# Patient Record
Sex: Female | Born: 1935 | Race: White | Hispanic: No
Health system: Southern US, Community
[De-identification: ages and names within clinical notes are randomized; demographics above are authoritative.]

---

## 2011-04-12 ENCOUNTER — Inpatient Hospital Stay: Payer: Self-pay | Admitting: Internal Medicine

## 2011-06-03 ENCOUNTER — Emergency Department: Payer: Self-pay | Admitting: Emergency Medicine

## 2011-06-03 LAB — COMPREHENSIVE METABOLIC PANEL
Alkaline Phosphatase: 43 U/L — ABNORMAL LOW (ref 50–136)
Anion Gap: 12 (ref 7–16)
BUN: 23 mg/dL — ABNORMAL HIGH (ref 7–18)
Calcium, Total: 8.9 mg/dL (ref 8.5–10.1)
Chloride: 112 mmol/L — ABNORMAL HIGH (ref 98–107)
Creatinine: 1.91 mg/dL — ABNORMAL HIGH (ref 0.60–1.30)
EGFR (African American): 32 — ABNORMAL LOW
Glucose: 85 mg/dL (ref 65–99)
SGOT(AST): 23 U/L (ref 15–37)
SGPT (ALT): 16 U/L
Sodium: 146 mmol/L — ABNORMAL HIGH (ref 136–145)

## 2011-06-03 LAB — CBC WITH DIFFERENTIAL/PLATELET
Basophil %: 0.3 %
Eosinophil %: 3.2 %
HCT: 26.5 % — ABNORMAL LOW (ref 35.0–47.0)
HGB: 9.3 g/dL — ABNORMAL LOW (ref 12.0–16.0)
Lymphocyte #: 2 10*3/uL (ref 1.0–3.6)
MCH: 35.2 pg — ABNORMAL HIGH (ref 26.0–34.0)
MCV: 101 fL — ABNORMAL HIGH (ref 80–100)
Monocyte #: 0.6 10*3/uL (ref 0.0–0.7)
Monocyte %: 6.8 %
Neutrophil #: 6.3 10*3/uL (ref 1.4–6.5)
Neutrophil %: 68.3 %
Platelet: 211 10*3/uL (ref 150–440)
RBC: 2.63 10*6/uL — ABNORMAL LOW (ref 3.80–5.20)

## 2011-06-20 ENCOUNTER — Emergency Department: Payer: Self-pay | Admitting: Emergency Medicine

## 2011-06-21 ENCOUNTER — Ambulatory Visit: Payer: Self-pay | Admitting: Internal Medicine

## 2011-06-24 ENCOUNTER — Ambulatory Visit: Payer: Self-pay | Admitting: Internal Medicine

## 2011-06-28 ENCOUNTER — Ambulatory Visit: Payer: Self-pay | Admitting: Internal Medicine

## 2011-06-28 LAB — CBC CANCER CENTER
Basophil %: 0.1 %
Comment - H1-Com1: NORMAL
Eosinophil #: 0.2 x10 3/mm (ref 0.0–0.7)
Eosinophil %: 2.2 %
Eosinophil: 4 %
Lymphocyte #: 1.7 x10 3/mm (ref 1.0–3.6)
Lymphocyte %: 18.2 %
Lymphocytes: 15 %
Monocyte #: 0.6 x10 3/mm (ref 0.0–0.7)
Monocyte %: 6.9 %
Monocytes: 5 %
Neutrophil #: 6.7 x10 3/mm — ABNORMAL HIGH (ref 1.4–6.5)
Neutrophil %: 72.6 %
Platelet: 236 x10 3/mm (ref 150–440)
RBC: 2.6 10*6/uL — ABNORMAL LOW (ref 3.80–5.20)
RDW: 15.1 % — ABNORMAL HIGH (ref 11.5–14.5)
WBC: 9.3 x10 3/mm (ref 3.6–11.0)

## 2011-06-28 LAB — IRON AND TIBC
Iron Bind.Cap.(Total): 262 ug/dL (ref 250–450)
Iron Saturation: 18 %
Iron: 48 ug/dL — ABNORMAL LOW (ref 50–170)

## 2011-06-28 LAB — LACTATE DEHYDROGENASE: LDH: 202 U/L (ref 84–246)

## 2011-06-28 LAB — RETICULOCYTES: Absolute Retic Count: 0.037 10*6/uL (ref 0.024–0.084)

## 2011-06-28 LAB — FOLATE: Folic Acid: 15.4 ng/mL (ref 3.1–100.0)

## 2011-06-29 LAB — PROT IMMUNOELECTROPHORES(ARMC)

## 2011-06-29 LAB — CANCER CENTER HEMOGLOBIN: HGB: 9.1 g/dL — ABNORMAL LOW (ref 12.0–16.0)

## 2011-07-01 ENCOUNTER — Inpatient Hospital Stay: Payer: Self-pay | Admitting: Internal Medicine

## 2011-07-01 LAB — BASIC METABOLIC PANEL
BUN: 25 mg/dL — ABNORMAL HIGH (ref 7–18)
Chloride: 109 mmol/L — ABNORMAL HIGH (ref 98–107)
EGFR (African American): 34 — ABNORMAL LOW
EGFR (Non-African Amer.): 28 — ABNORMAL LOW
Glucose: 208 mg/dL — ABNORMAL HIGH (ref 65–99)
Osmolality: 301 (ref 275–301)
Potassium: 4.3 mmol/L (ref 3.5–5.1)
Sodium: 146 mmol/L — ABNORMAL HIGH (ref 136–145)

## 2011-07-01 LAB — URINALYSIS, COMPLETE
Glucose,UR: 50 mg/dL (ref 0–75)
Ketone: NEGATIVE
Ph: 6 (ref 4.5–8.0)
Protein: 100
Specific Gravity: 1.008 (ref 1.003–1.030)

## 2011-07-01 LAB — CBC WITH DIFFERENTIAL/PLATELET
Basophil %: 0.2 %
Eosinophil %: 2.1 %
HCT: 28.3 % — ABNORMAL LOW (ref 35.0–47.0)
HGB: 9.6 g/dL — ABNORMAL LOW (ref 12.0–16.0)
Lymphocyte #: 1.6 10*3/uL (ref 1.0–3.6)
MCH: 32 pg (ref 26.0–34.0)
MCV: 94 fL (ref 80–100)
Monocyte #: 0.6 10*3/uL (ref 0.0–0.7)
Monocyte %: 5.9 %
Neutrophil #: 7.3 10*3/uL — ABNORMAL HIGH (ref 1.4–6.5)
Neutrophil %: 75.1 %
RBC: 3.01 10*6/uL — ABNORMAL LOW (ref 3.80–5.20)
WBC: 9.7 10*3/uL (ref 3.6–11.0)

## 2011-07-02 LAB — COMPREHENSIVE METABOLIC PANEL
Albumin: 2.5 g/dL — ABNORMAL LOW (ref 3.4–5.0)
Alkaline Phosphatase: 42 U/L — ABNORMAL LOW (ref 50–136)
Anion Gap: 8 (ref 7–16)
Calcium, Total: 8.6 mg/dL (ref 8.5–10.1)
Chloride: 107 mmol/L (ref 98–107)
Co2: 27 mmol/L (ref 21–32)
EGFR (African American): 33 — ABNORMAL LOW
EGFR (Non-African Amer.): 27 — ABNORMAL LOW
Osmolality: 288 (ref 275–301)
Potassium: 3.7 mmol/L (ref 3.5–5.1)
Total Protein: 5.6 g/dL — ABNORMAL LOW (ref 6.4–8.2)

## 2011-07-02 LAB — CBC WITH DIFFERENTIAL/PLATELET
Basophil #: 0 10*3/uL (ref 0.0–0.1)
Basophil %: 0.3 %
Eosinophil #: 0.3 10*3/uL (ref 0.0–0.7)
Eosinophil %: 4.1 %
HGB: 8.7 g/dL — ABNORMAL LOW (ref 12.0–16.0)
Lymphocyte #: 1.9 10*3/uL (ref 1.0–3.6)
MCH: 31.7 pg (ref 26.0–34.0)
MCHC: 34 g/dL (ref 32.0–36.0)
MCV: 93 fL (ref 80–100)
Monocyte #: 0.7 10*3/uL (ref 0.0–0.7)
Neutrophil #: 4.4 10*3/uL (ref 1.4–6.5)
Neutrophil %: 60.1 %
RBC: 2.75 10*6/uL — ABNORMAL LOW (ref 3.80–5.20)
WBC: 7.3 10*3/uL (ref 3.6–11.0)

## 2011-07-03 LAB — HEMOGLOBIN: HGB: 9.6 g/dL — ABNORMAL LOW (ref 12.0–16.0)

## 2011-07-03 LAB — BASIC METABOLIC PANEL
Anion Gap: 9 (ref 7–16)
BUN: 25 mg/dL — ABNORMAL HIGH (ref 7–18)
Calcium, Total: 8.9 mg/dL (ref 8.5–10.1)
Chloride: 105 mmol/L (ref 98–107)
EGFR (Non-African Amer.): 26 — ABNORMAL LOW
Glucose: 56 mg/dL — ABNORMAL LOW (ref 65–99)
Osmolality: 283 (ref 275–301)
Potassium: 4.2 mmol/L (ref 3.5–5.1)

## 2011-07-03 LAB — FERRITIN: Ferritin (ARMC): 159 ng/mL (ref 8–388)

## 2011-07-04 LAB — CBC WITH DIFFERENTIAL/PLATELET
Basophil #: 0.1 10*3/uL (ref 0.0–0.1)
Basophil %: 0.8 %
Eosinophil #: 0.3 10*3/uL (ref 0.0–0.7)
Eosinophil %: 4.7 %
HGB: 9.2 g/dL — ABNORMAL LOW (ref 12.0–16.0)
Lymphocyte #: 2.6 10*3/uL (ref 1.0–3.6)
MCV: 94 fL (ref 80–100)
Monocyte %: 8.4 %
Neutrophil %: 51 %
Platelet: 216 10*3/uL (ref 150–440)
RBC: 2.91 10*6/uL — ABNORMAL LOW (ref 3.80–5.20)
WBC: 7.4 10*3/uL (ref 3.6–11.0)

## 2011-07-04 LAB — BASIC METABOLIC PANEL
BUN: 30 mg/dL — ABNORMAL HIGH (ref 7–18)
Chloride: 103 mmol/L (ref 98–107)
Creatinine: 2.04 mg/dL — ABNORMAL HIGH (ref 0.60–1.30)
EGFR (African American): 30 — ABNORMAL LOW
EGFR (Non-African Amer.): 25 — ABNORMAL LOW
Glucose: 104 mg/dL — ABNORMAL HIGH (ref 65–99)
Osmolality: 291 (ref 275–301)
Potassium: 3.9 mmol/L (ref 3.5–5.1)
Sodium: 143 mmol/L (ref 136–145)

## 2011-07-05 LAB — URINALYSIS, COMPLETE
Blood: NEGATIVE
Ketone: NEGATIVE
Nitrite: NEGATIVE
Ph: 6 (ref 4.5–8.0)
Protein: 100
RBC,UR: 3 /HPF (ref 0–5)
WBC UR: 674 /HPF (ref 0–5)

## 2011-07-05 LAB — BASIC METABOLIC PANEL
Anion Gap: 8 (ref 7–16)
BUN: 31 mg/dL — ABNORMAL HIGH (ref 7–18)
Calcium, Total: 8.8 mg/dL (ref 8.5–10.1)
Creatinine: 2.15 mg/dL — ABNORMAL HIGH (ref 0.60–1.30)
EGFR (African American): 28 — ABNORMAL LOW
EGFR (Non-African Amer.): 23 — ABNORMAL LOW
Glucose: 131 mg/dL — ABNORMAL HIGH (ref 65–99)
Osmolality: 280 (ref 275–301)
Potassium: 3.9 mmol/L (ref 3.5–5.1)

## 2011-07-06 LAB — CBC WITH DIFFERENTIAL/PLATELET
Basophil %: 0.1 %
Eosinophil #: 0.2 10*3/uL (ref 0.0–0.7)
HCT: 25.9 % — ABNORMAL LOW (ref 35.0–47.0)
HGB: 8.6 g/dL — ABNORMAL LOW (ref 12.0–16.0)
Lymphocyte %: 31.1 %
MCHC: 33 g/dL (ref 32.0–36.0)
MCV: 94 fL (ref 80–100)
Monocyte #: 0.6 10*3/uL (ref 0.0–0.7)
Monocyte %: 6.9 %
Neutrophil #: 4.9 10*3/uL (ref 1.4–6.5)
RBC: 2.75 10*6/uL — ABNORMAL LOW (ref 3.80–5.20)
WBC: 8.2 10*3/uL (ref 3.6–11.0)

## 2011-07-06 LAB — BASIC METABOLIC PANEL
Anion Gap: 9 (ref 7–16)
BUN: 34 mg/dL — ABNORMAL HIGH (ref 7–18)
Calcium, Total: 8.9 mg/dL (ref 8.5–10.1)
Creatinine: 2.17 mg/dL — ABNORMAL HIGH (ref 0.60–1.30)
EGFR (African American): 28 — ABNORMAL LOW
EGFR (Non-African Amer.): 23 — ABNORMAL LOW
Glucose: 154 mg/dL — ABNORMAL HIGH (ref 65–99)
Potassium: 4.1 mmol/L (ref 3.5–5.1)
Sodium: 139 mmol/L (ref 136–145)

## 2011-07-06 LAB — URINE CULTURE

## 2011-07-08 LAB — URINE CULTURE

## 2011-07-13 ENCOUNTER — Inpatient Hospital Stay: Payer: Self-pay | Admitting: Internal Medicine

## 2011-07-13 LAB — CBC
MCHC: 34.2 g/dL (ref 32.0–36.0)
Platelet: 231 10*3/uL (ref 150–440)
RBC: 2.81 10*6/uL — ABNORMAL LOW (ref 3.80–5.20)
WBC: 12.6 10*3/uL — ABNORMAL HIGH (ref 3.6–11.0)

## 2011-07-13 LAB — URINALYSIS, COMPLETE
Glucose,UR: NEGATIVE mg/dL (ref 0–75)
Hyaline Cast: 11
Ketone: NEGATIVE
Nitrite: NEGATIVE
Ph: 5 (ref 4.5–8.0)
RBC,UR: 7 /HPF (ref 0–5)
Specific Gravity: 1.012 (ref 1.003–1.030)
Squamous Epithelial: 1
WBC UR: 189 /HPF (ref 0–5)

## 2011-07-13 LAB — LIPASE, BLOOD: Lipase: 129 U/L (ref 73–393)

## 2011-07-13 LAB — COMPREHENSIVE METABOLIC PANEL
Alkaline Phosphatase: 35 U/L — ABNORMAL LOW (ref 50–136)
Bilirubin,Total: 0.3 mg/dL (ref 0.2–1.0)
Calcium, Total: 8.7 mg/dL (ref 8.5–10.1)
Co2: 21 mmol/L (ref 21–32)
Creatinine: 2.36 mg/dL — ABNORMAL HIGH (ref 0.60–1.30)
EGFR (African American): 25 — ABNORMAL LOW
EGFR (Non-African Amer.): 21 — ABNORMAL LOW
Osmolality: 290 (ref 275–301)
Potassium: 4.7 mmol/L (ref 3.5–5.1)
SGOT(AST): 28 U/L (ref 15–37)
SGPT (ALT): 17 U/L

## 2011-07-14 LAB — BASIC METABOLIC PANEL
Anion Gap: 10 (ref 7–16)
Calcium, Total: 8.5 mg/dL (ref 8.5–10.1)
Chloride: 107 mmol/L (ref 98–107)
Co2: 24 mmol/L (ref 21–32)
Creatinine: 2.51 mg/dL — ABNORMAL HIGH (ref 0.60–1.30)
Osmolality: 293 (ref 275–301)
Potassium: 4.4 mmol/L (ref 3.5–5.1)

## 2011-07-14 LAB — CBC WITH DIFFERENTIAL/PLATELET
Basophil #: 0 10*3/uL (ref 0.0–0.1)
Eosinophil #: 0.2 10*3/uL (ref 0.0–0.7)
Eosinophil %: 1.9 %
Lymphocyte %: 23.6 %
MCH: 32.5 pg (ref 26.0–34.0)
Monocyte #: 0.8 10*3/uL — ABNORMAL HIGH (ref 0.0–0.7)
Neutrophil %: 65.8 %
Platelet: 201 10*3/uL (ref 150–440)
RBC: 2.42 10*6/uL — ABNORMAL LOW (ref 3.80–5.20)

## 2011-07-14 LAB — CK: CK, Total: 409 U/L — ABNORMAL HIGH (ref 21–215)

## 2011-07-15 LAB — CBC WITH DIFFERENTIAL/PLATELET
Basophil #: 0.1 10*3/uL (ref 0.0–0.1)
Basophil %: 1.2 %
Eosinophil #: 0.1 10*3/uL (ref 0.0–0.7)
HCT: 27.3 % — ABNORMAL LOW (ref 35.0–47.0)
HGB: 9 g/dL — ABNORMAL LOW (ref 12.0–16.0)
Lymphocyte %: 23.7 %
MCHC: 33 g/dL (ref 32.0–36.0)
Monocyte %: 9.8 %
Neutrophil #: 6.5 10*3/uL (ref 1.4–6.5)
Neutrophil %: 64.1 %
Platelet: 197 10*3/uL (ref 150–440)
RBC: 2.99 10*6/uL — ABNORMAL LOW (ref 3.80–5.20)
RDW: 20 % — ABNORMAL HIGH (ref 11.5–14.5)
WBC: 10.1 10*3/uL (ref 3.6–11.0)

## 2011-07-15 LAB — BASIC METABOLIC PANEL
Anion Gap: 15 (ref 7–16)
BUN: 42 mg/dL — ABNORMAL HIGH (ref 7–18)
Calcium, Total: 8.2 mg/dL — ABNORMAL LOW (ref 8.5–10.1)
Co2: 21 mmol/L (ref 21–32)
EGFR (Non-African Amer.): 19 — ABNORMAL LOW
Glucose: 157 mg/dL — ABNORMAL HIGH (ref 65–99)
Osmolality: 293 (ref 275–301)
Potassium: 4.6 mmol/L (ref 3.5–5.1)

## 2011-07-15 LAB — SEDIMENTATION RATE: Erythrocyte Sed Rate: 44 mm/hr — ABNORMAL HIGH (ref 0–30)

## 2011-07-16 LAB — URINE CULTURE

## 2011-07-17 LAB — CBC WITH DIFFERENTIAL/PLATELET
Basophil #: 0 10*3/uL (ref 0.0–0.1)
Basophil %: 0.2 %
Eosinophil %: 0.2 %
HCT: 28.4 % — ABNORMAL LOW (ref 35.0–47.0)
Lymphocyte #: 1.9 10*3/uL (ref 1.0–3.6)
Lymphocyte %: 18.1 %
MCH: 30.5 pg (ref 26.0–34.0)
MCHC: 33.7 g/dL (ref 32.0–36.0)
Monocyte %: 7.5 %
Neutrophil #: 7.7 10*3/uL — ABNORMAL HIGH (ref 1.4–6.5)
Platelet: 229 10*3/uL (ref 150–440)
RBC: 3.13 10*6/uL — ABNORMAL LOW (ref 3.80–5.20)
RDW: 19.1 % — ABNORMAL HIGH (ref 11.5–14.5)
WBC: 10.4 10*3/uL (ref 3.6–11.0)

## 2011-07-17 LAB — BASIC METABOLIC PANEL
Creatinine: 2.64 mg/dL — ABNORMAL HIGH (ref 0.60–1.30)
EGFR (African American): 22 — ABNORMAL LOW
EGFR (Non-African Amer.): 18 — ABNORMAL LOW
Osmolality: 294 (ref 275–301)
Sodium: 133 mmol/L — ABNORMAL LOW (ref 136–145)

## 2011-07-19 LAB — BASIC METABOLIC PANEL
Anion Gap: 11 (ref 7–16)
Calcium, Total: 9.4 mg/dL (ref 8.5–10.1)
Chloride: 99 mmol/L (ref 98–107)
Creatinine: 2.52 mg/dL — ABNORMAL HIGH (ref 0.60–1.30)
EGFR (African American): 23 — ABNORMAL LOW
EGFR (Non-African Amer.): 19 — ABNORMAL LOW
Glucose: 248 mg/dL — ABNORMAL HIGH (ref 65–99)
Osmolality: 292 (ref 275–301)
Sodium: 133 mmol/L — ABNORMAL LOW (ref 136–145)

## 2011-07-19 LAB — CBC WITH DIFFERENTIAL/PLATELET
Basophil #: 0 10*3/uL (ref 0.0–0.1)
Basophil %: 0.4 %
Eosinophil #: 0.2 10*3/uL (ref 0.0–0.7)
Eosinophil %: 1.6 %
HCT: 31.7 % — ABNORMAL LOW (ref 35.0–47.0)
HGB: 10.5 g/dL — ABNORMAL LOW (ref 12.0–16.0)
Lymphocyte #: 2.1 10*3/uL (ref 1.0–3.6)
MCH: 30.9 pg (ref 26.0–34.0)
MCHC: 33.1 g/dL (ref 32.0–36.0)
MCV: 93 fL (ref 80–100)
Monocyte #: 0.9 10*3/uL — ABNORMAL HIGH (ref 0.0–0.7)
Neutrophil #: 7.6 10*3/uL — ABNORMAL HIGH (ref 1.4–6.5)
Neutrophil %: 70.2 %
RBC: 3.41 10*6/uL — ABNORMAL LOW (ref 3.80–5.20)

## 2011-07-19 LAB — CULTURE, BLOOD (SINGLE)

## 2011-07-22 ENCOUNTER — Ambulatory Visit: Payer: Self-pay | Admitting: Internal Medicine

## 2011-08-22 DEATH — deceased

## 2013-02-12 IMAGING — CR PELVIS - 1-2 VIEW
1 series · 1 of 1 positions shown · non-contrast
Comparison: none

REASON FOR EXAM: Left groin pain , s/p fall
COMMENTS:

PROCEDURE:     DXR - DXR PELVIS AP ONLY  - April 12, 2011 [DATE]
RESULT:     The bony pelvis is mildly osteopenic. I do not see evidence of
an acute fracture. There is narrowing of the hip joints. There are
phleboliths within the pelvis. The bowel gas pattern is nonspecific.

[view not recorded]
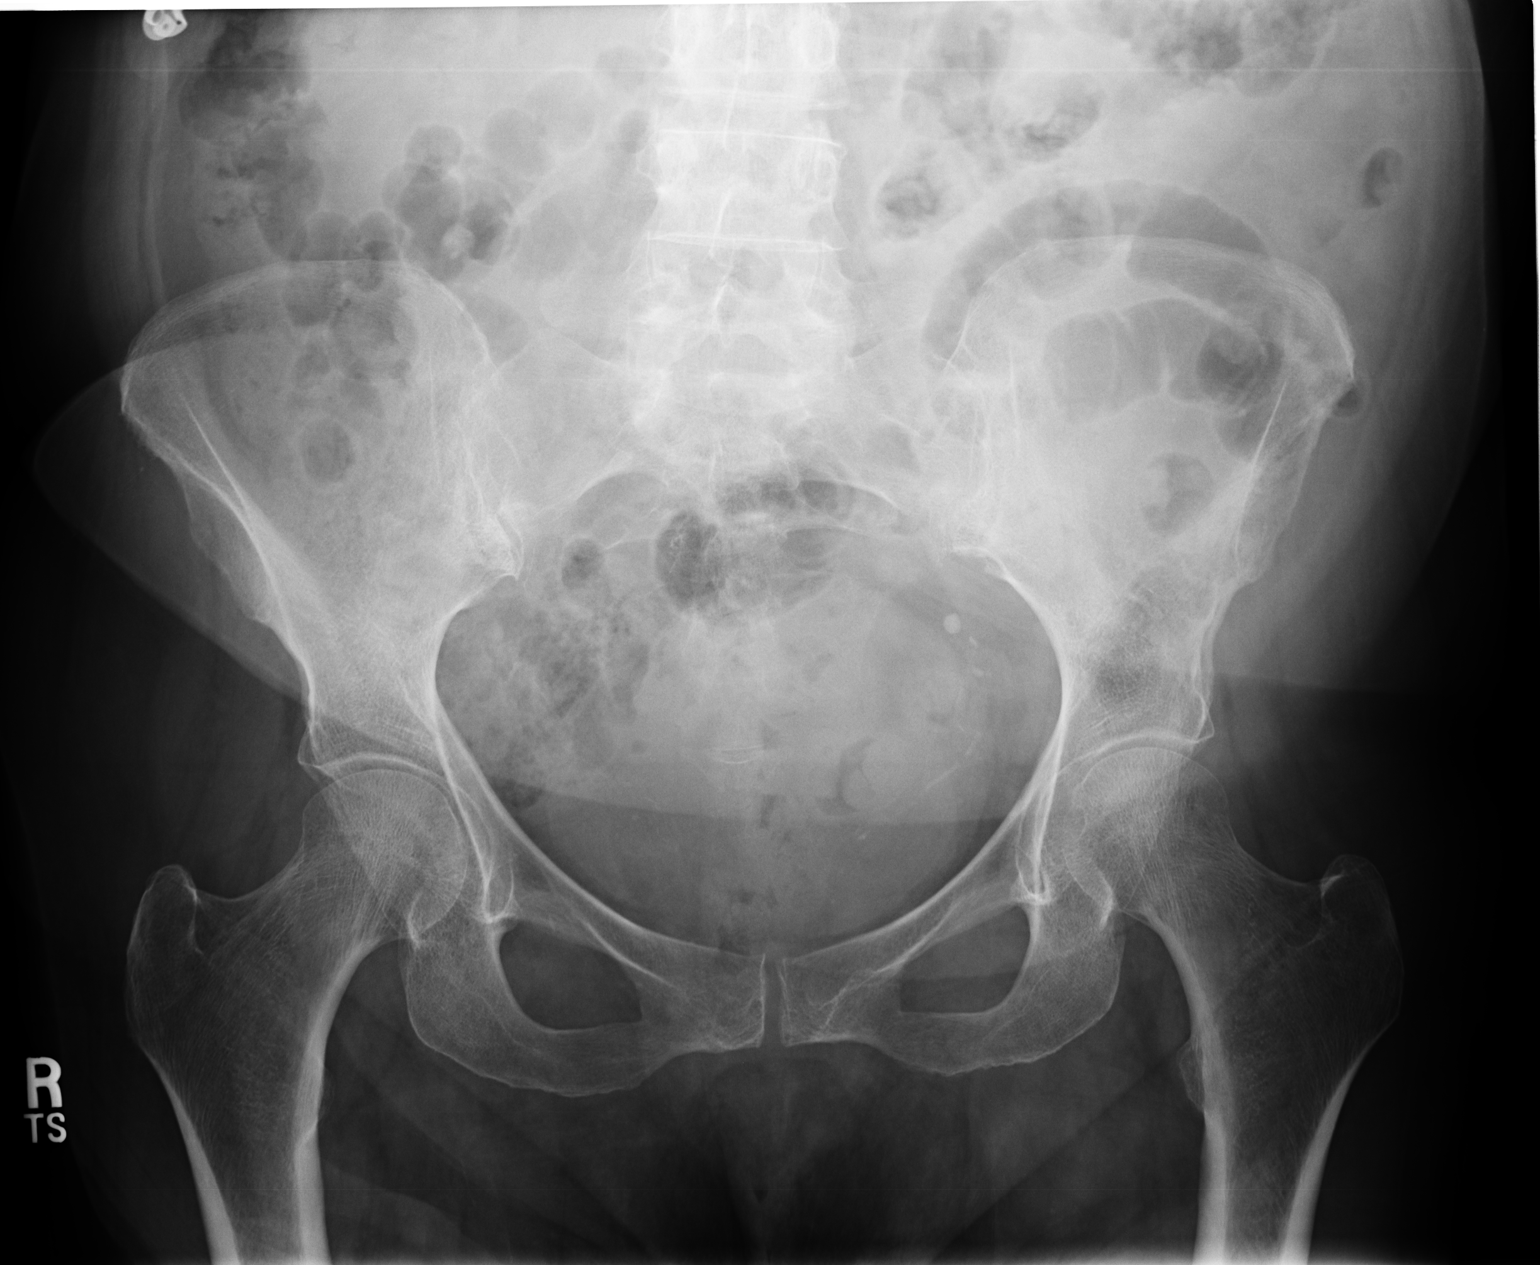

[1 of 1 positions shown; findings below may reference images not displayed]

IMPRESSION: I do not see evidence of an acute fracture of the bony
pelvis. There are degenerative changes of both hips. Followup imaging is
available if there are strong clinical concerns of an occult fracture.

## 2013-02-13 IMAGING — CT CT OF THE LEFT HIP WITHOUT CONTRAST
1 series · 16 of 32 positions shown, 20 images · non-contrast
Comparison: none

REASON FOR EXAM: pain s/p fall, unable to ambulate, r/o fracture
COMMENTS:

PROCEDURE:     CT  - CT HIP LEFT WITHOUT CONTRAST  - April 13, 2011  [DATE]
RESULT:
TECHNIQUE: Multislice helical acquisition through the left hip is
reconstructed in the axial, coronal and sagittal planes at 2.0 mm slice
thickness.
There is no previous CT for comparison.

[Series 2: hip 3.0 b70s · axial · 0.38mm/px · z∈[-840,-714]mm · 16 of 47 slices shown, 20 images]
[im 3/47  soft-tissue]
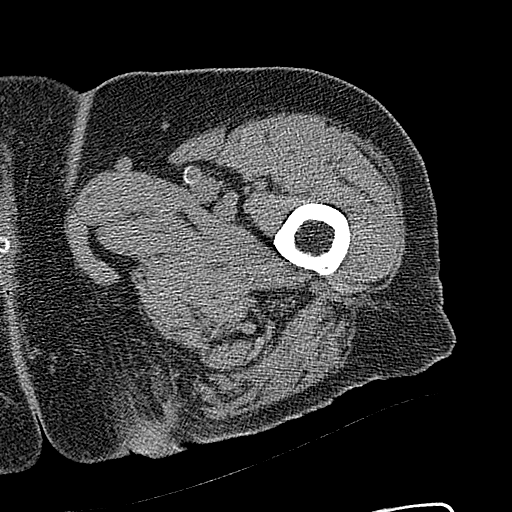
[im 3/47  bone]
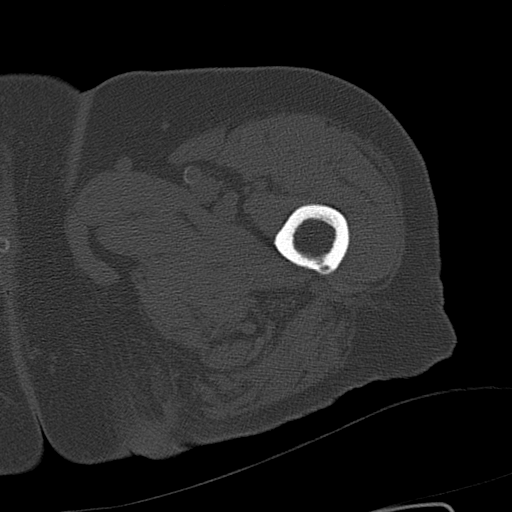
[im 6/47  soft-tissue]
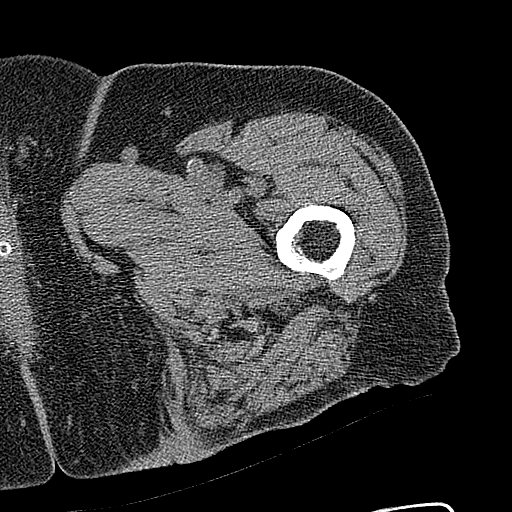
[im 9/47  soft-tissue]
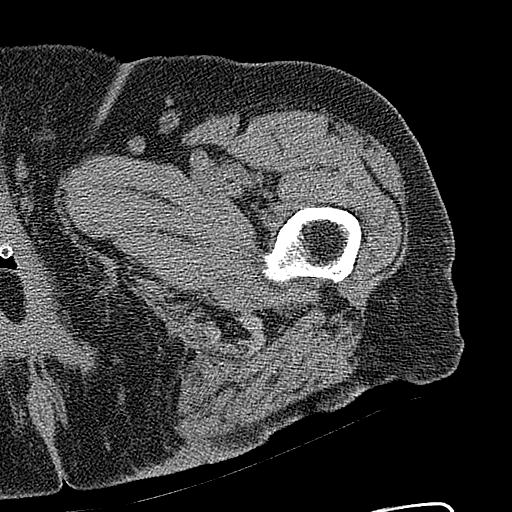
[im 12/47  soft-tissue]
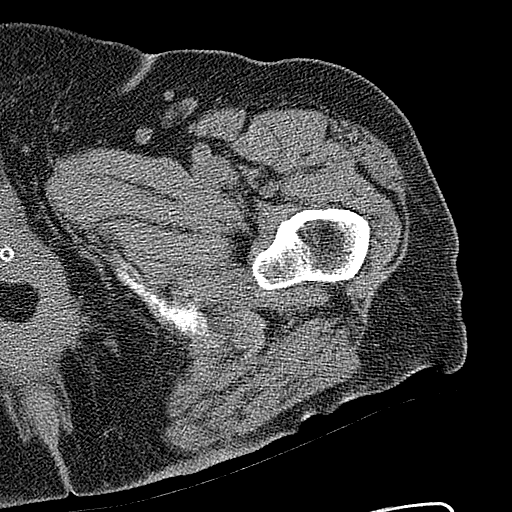
[im 15/47  soft-tissue]
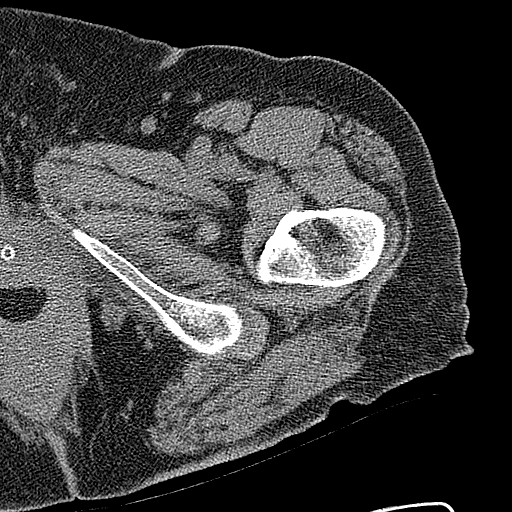
[im 18/47  soft-tissue]
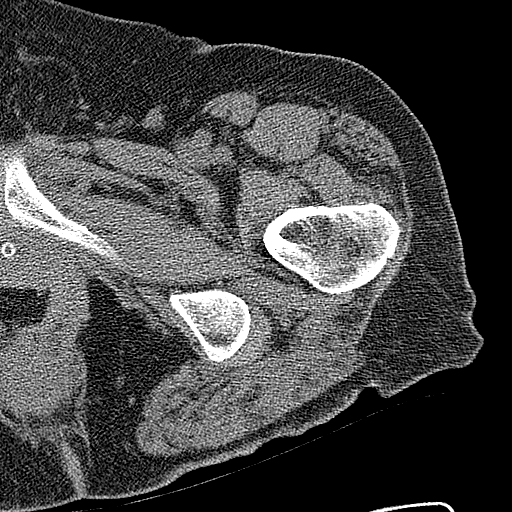
[im 21/47  soft-tissue]
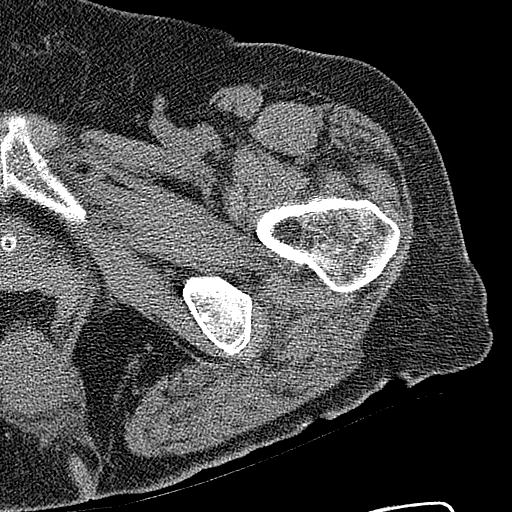
[im 26/47  soft-tissue]
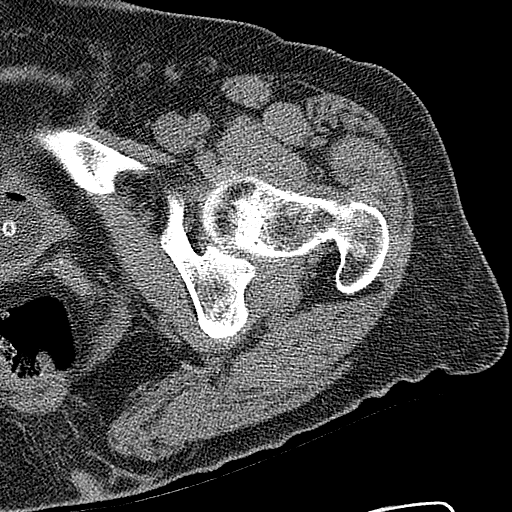
[im 29/47  soft-tissue]
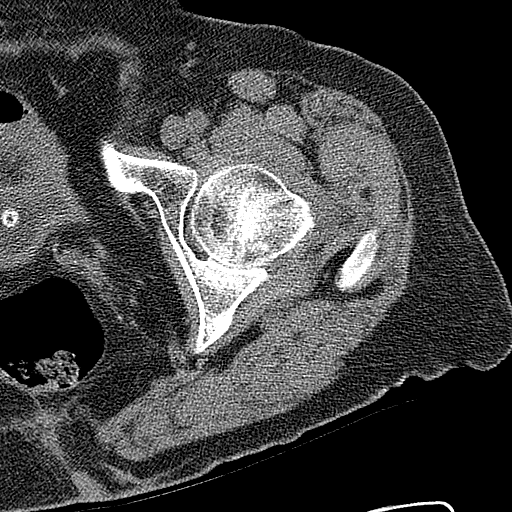
[im 29/47  bone]
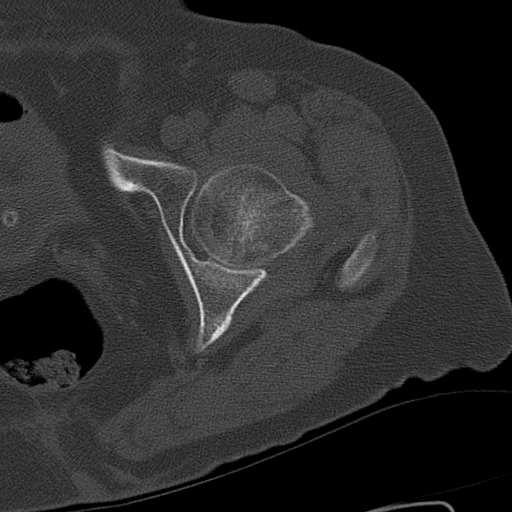
[im 32/47  soft-tissue]
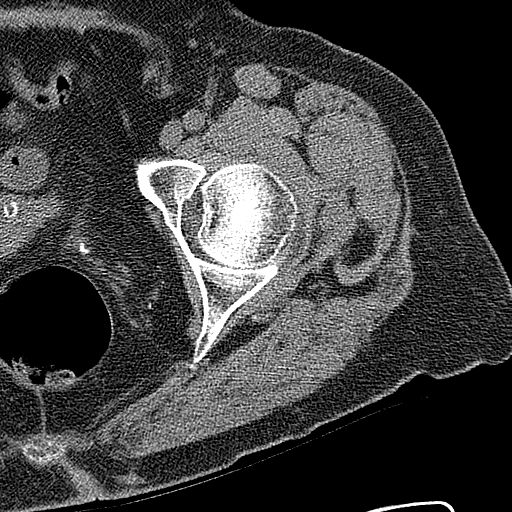
[im 35/47  soft-tissue]
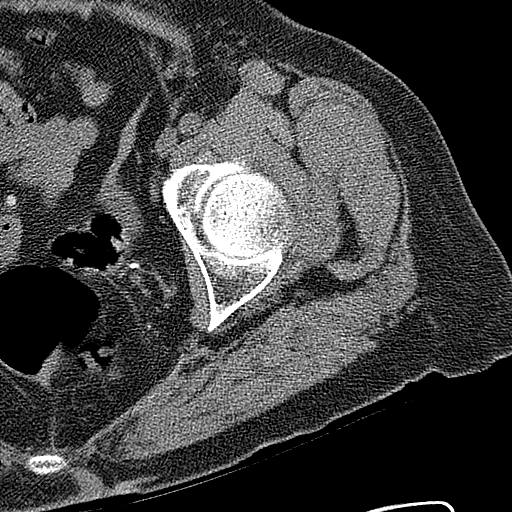
[im 38/47  soft-tissue]
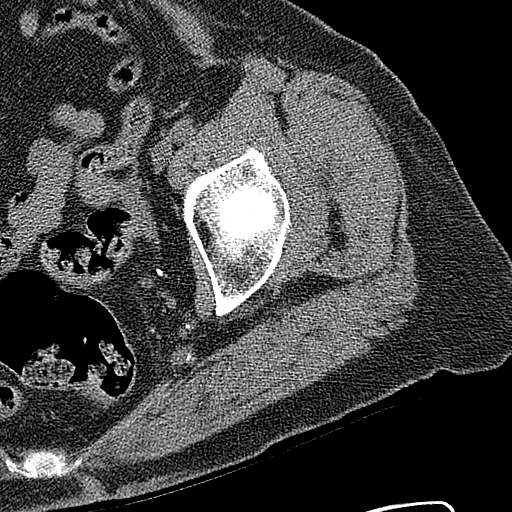
[im 41/47  soft-tissue]
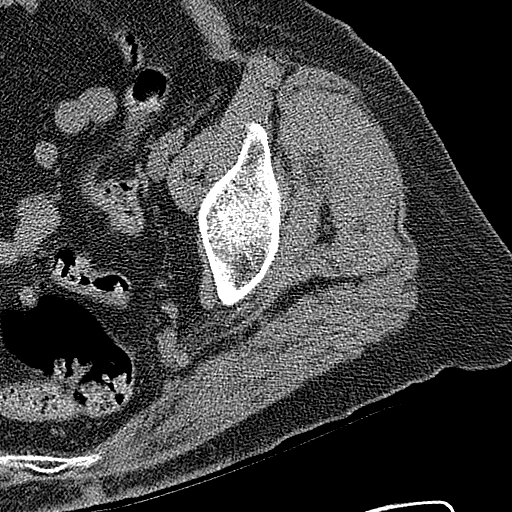
[im 41/47  lung]
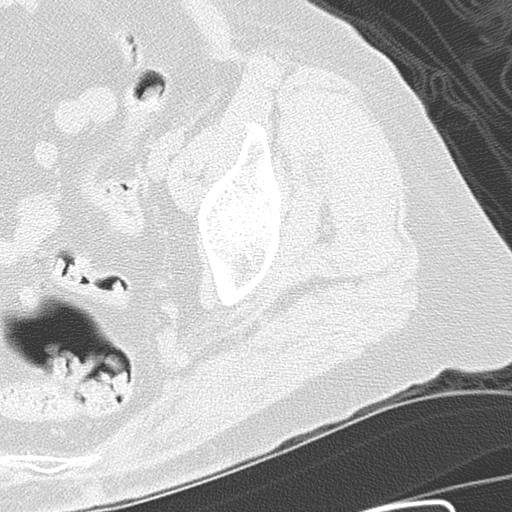
[im 42/47  lung]
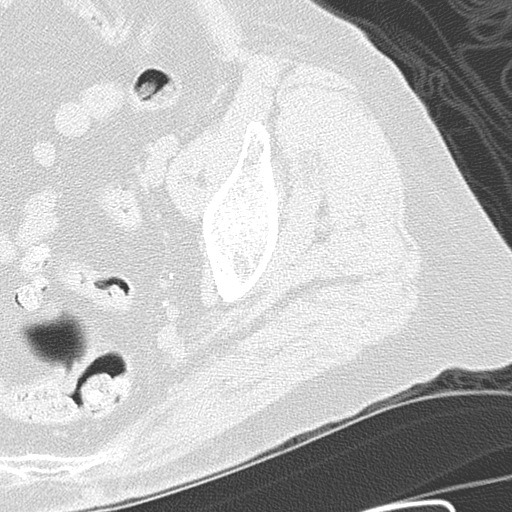
[im 44/47  soft-tissue]
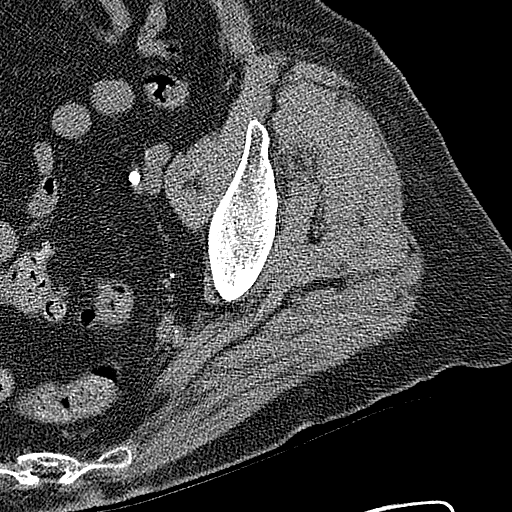
[im 44/47  lung]
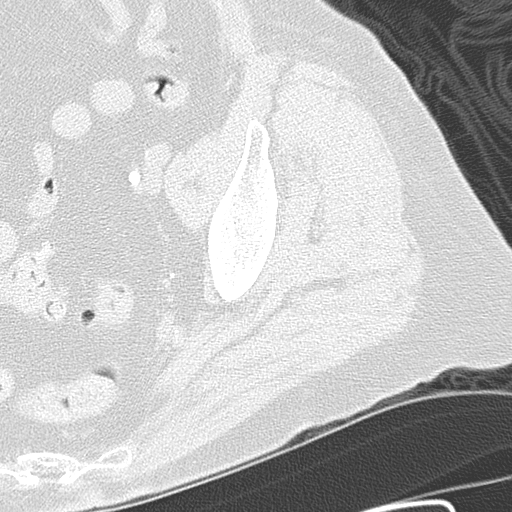
[im 45/47  lung]
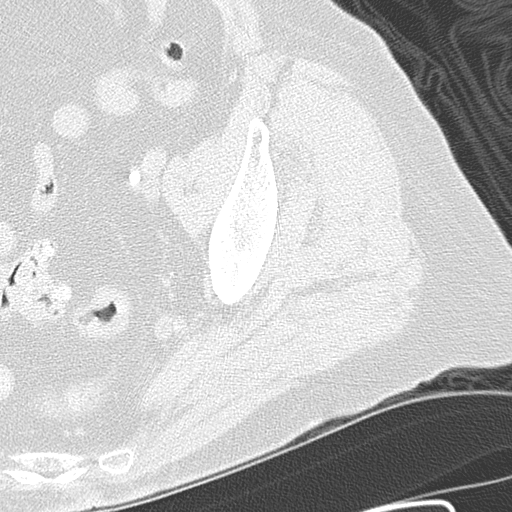

[16 of 32 positions shown; findings below may reference images not displayed]

FINDINGS: The visualized bony pelvis is intact. The femoral head is located
in the acetabulum. The proximal femur is intact. There is no cortical
destruction or fracture.
IMPRESSION: No acute left hip bony abnormality.

## 2014-09-14 NOTE — Consult Note (Signed)
PATIENT NAME:  Madison Stanley, Madison Stanley [id number] OF BIRTH:  1923-07-20 OF ADMISSION:  02/08/2013OF CONSULTATION:  07/05/2011  REFERRING PHYSICIAN: Enid Baas, MDPHYSICIAN:  Kristine Linea, MD FOR CONSULTATION: To evaluate an elderly patient with new onset depression.   DATA: Ms. Frank is an 81 year old female with no psychiatric history.   COMPLAINT: "I worry." OF PRESENT ILLNESS: Ms. Fiallos has no psychiatric history. She was admitted for worsening of edema. She suffered a CVA in November 2012 and has been wheel chair bound since. There is residual left sided weakness that likely led to a fall off the chair. She has bruises on the left side of her face. Until her stroke, the patient had been living independently, driving and cooking. She now worries that unless she is able to walk again, she will not be able to return to her home. It may not be an option as she has several physical problems and her health has been deteriorating. She looks frail, has severe anemia requiring blood transfusion and feels tired. Her edema did not respond to oral treatment. The patient complains of poor sleep, decreased appetite but is unsure if she lost weight. She feels sad and uncertain about her future. Her daughter reported that the patient has been crying a lot at night. She has been treated with Remeron for depression. She was started on Zoloft in the hospital. PSYCHIATRIC HISTORY: None reported. PSYCHIATRIC HISTORY: Unknown. MEDICAL HISTORY:  1. Hypertension.  2. Type 2 diabetes mellitus, insulin-dependent.  Coronary artery disease with stent placement.  Diverticulosis.  Osteoarthritis.  Anemia of chronic disease.  Cerebrovascular accident with acute right parietofrontal infarct resulting in residual left-sided weakness.  Carotid stenosis, left internal carotid artery, 50% to 75% stenosis.   DRUG ALLERGIES: Sulfa, metformin, penicillin, Demerol. MEDICATIONS:  1. Lantus 10 units subcutaneous at bedtime.  2. Ferrous  sulfate 325 mg p.o. b.i.d.  Aggrenox 25/200 mg one capsule p.o. b.i.d.  Synthroid 88 mcg p.o. daily.  Toprol-XL 50 mg p.o. daily.  Lisinopril 40 mg p.o. daily.  Norvasc 10 mg p.o. daily.  Flomax 0.4 mg p.o. daily.  Vitamin B12 500 mcg p.o. daily.  Bumex 1 mg p.o. daily.  Glyburide 10 mg p.o. daily.  Lasix 10 mg p.o. daily.  Zocor 20 mg p.o. at bedtime.  Remeron 15 mg p.o. at bedtime.  Vicodin 5/500 one tablet every four hours p.r.n. for pain.  Tylenol 650 mg p.o. every 4 hours p.r.n. for pain.  Ultram 50 mg p.o. every 6 hours p.r.n. for pain.  Xanax 0.5 mg p.o. at bedtime p.r.n.  Xanax 0.5 mg p.o. twice a day p.r.n. for anxiety.  Cipro 250 mg p.o. twice a day for seven days for urinary tract infection, will be finished on 07/02/2011. HISTORY: She is a retired Engineer, civil (consulting). She used to live independently until her CVA in November 2012. She currently resides at [facility]. No history of alcohol or tobacco use. She has two daughters one of which is a physical therapist and the other one is an Nutritional therapist.  OF SYSTEMS: CONSTITUTIONAL: No fever. Positive for fatigue and weakness. EYES: No double or blurred vision. ENT: No hearing loss. RESPIRATORY: No cough or shortness of breath. CARDIOVASCULAR: No chest pain or orthopnea. Positive for edema. GI: No nausea, vomiting or abdominal pain. GENITOURINARY: Positive for UTI. ENDOCRINE: No heat or cold intolerance. HEMATOLOGY: Positive for anemia. No easy bruising or bleeding. SKIN: No acne or rash. MUSCULOSKELETAL: Positive for chronic low back pain and arthritis. NEUROLOGIC: Recent cerebrovascular accident with left residual  hemiparesis. PSYCHIATRIC: See history of present illness for details.   EXAMINATION: VITAL SIGNS: blood pressure 111/58, pulse 68, respirations 20, temperature 97. GENERAL: This is a well-nourished elderly female slumped in bed in no acute distress. The rest of the physical examination is deferred to the primary attending. Chemistries within normal limits  except glucose 131, BUN 31, creatinine 2.15. LFTs within normal limits, CBC: WBC 7.4, hemoglobin 9.1, hematocrit 27.5, platelets 219. UA is suggestive of UTI with LE 3+ and 403 WBC.   STATUS EXAMINATION: The patient is alert and oriented to person, place and time, not so much to the situation. She is unable to explain why she is in the hospital again even though she is a good historian. She wears hospital gown that is untied. The room is cold but the patient does not wish to increase the temperature. There is some eye contact. Her speech is clear. There are no problems with hearing. She is preoccupied with thoughts about going home. She seems to be completely unrealistic in her expectations. She understands that she will not be able to return home if she is unable to walk. She worked with PT today and this went "well". She is unable to tell me how far she went if at all. I was in the room when her lunch tray came in. She was interested in eating her hamburger. She needed help to do so. Her mood is "sad" with flat affect. No crying. She denies thoughts of hurting herself or others. There are no psychotic symptoms. Cognition seems intact, actually very good for her age. Her insight and judgment are questionable.  RISK ASSESSMENT: This is a patient with new onset depression in the context of worsening of medical condition and loss of way of life. She needs support and supervision.     DIAGNOSES: I:  Adjustment disorder with depressed mood. II:  Deferred,   AXIS III:  UTI, CKD, Hypertension, diabetes, Coronary artery disease with stent placement, Diverticulosis, Osteoarthritis, Anemia of chronic disease, Cerebrovascular accident with left-sided weakness, Carotid stenosis.  AXIS IV:  Physical illness, loss of way of life.   V:  GAF 40.  Will decrease Remeron to better address insomnia.  Will increase Zoloft to 50 mg daily.  I will follow up.    Electronic Signatures: Kristine LineaPucilowska, Jolanta (MD)  (Signed on  13-Feb-13 10:22)  Authored  Last Updated: 13-Feb-13 10:22 by Kristine LineaPucilowska, Jolanta (MD)

## 2014-09-14 NOTE — Consult Note (Signed)
PATIENT NAME:  Madison Stanley, Madison Stanley MR#:  [id number] DATE OF BIRTH:  [birth year]  DATE OF CONSULTATION:  07/15/2011  REFERRING PHYSICIAN:   CONSULTING PHYSICIAN:  Laurene Footman, MD  REASON FOR CONSULTATION: Left hip and groin pain.   HISTORY OF PRESENT ILLNESS: Patient is an 81 year old patient with multiple medical problems including renal disease not yet on dialysis. She has been having increased pain for two days prior to admission, had difficulty flexing the hip with significant pain. She is admitted because of her difficulty with ambulation. She does have prior left-sided weakness secondary to a CVA and subdural hematoma. There is history of a fall recently and urinary tract infection.   PHYSICAL EXAMINATION: On exam she has no pain with gentle hip logrolling. With passive flexion and extension there is some pain in the anterior hip. She has slight tenderness to palpation at the anterior hip. There is no warmth or erythema about the hip. No significant pain with very gentle and slow internal rotation or external rotation. She does have a great deal of pain with attempt at flexion actively.   LABORATORY, DIAGNOSTIC AND RADIOLOGICAL DATA: She had an MRI done yesterday that showed an apparent injury to the psoas tendon with rupture at the attachment.  CLINICAL IMPRESSION: Psoas tendon rupture with adjacent inflammation.   RECOMMENDATIONS: Ordered a CRP and ESR although I doubt infection. Potential for improvement with time is likely. Additionally, physical therapy has been ordered. I do not know if she has been on quinolones as they can potentially cause tendon rupture although in this area I have not seen that. She will continue to be followed here in the hospital.  ____________________________ Laurene Footman, MD mjm:cms D: 07/15/2011 07:51:00 ET T: 07/15/2011 08:10:04 ET JOB#: [id number]  cc: Laurene Footman, MD, <Dictator> Laurene Footman MD ELECTRONICALLY SIGNED 07/15/2011 17:30

## 2014-09-14 NOTE — Consult Note (Signed)
Brief Consult Note: Diagnosis: Adjustmant disorder with depressed mood.   Patient was seen by consultant.   Consult note dictated.   Recommend further assessment or treatment.   Orders entered.   Comments: Ms. Delton Seeelson has no psychiatric history, not even cogntive decline. She has been taking Remeron prescribed by her PCP. She has been living independently. She feels depressed as she fears that she may not be able to return to her independent living if she is unable to walk. There is a h/o of falls. She is working with PT.  PLAN: 1. She is on Remeron. I will lower Remeron to 7.5. The patient has problems sleeping. The higher the Remeron dose the less sleep inducing activity.  2. She has been already started on Zoloft. Will increase Zoloft to 50 mg.   3. I will follow up.  Electronic Signatures: Kristine LineaPucilowska, Kallan Merrick (MD)  (Signed 12-Feb-13 13:26)  Authored: Brief Consult Note   Last Updated: 12-Feb-13 13:26 by Kristine LineaPucilowska, Aleynah Rocchio (MD)

## 2014-09-14 NOTE — Consult Note (Signed)
    Comments   Follow up visit made. Pt is still sedated, unresponsive. Spoke with daughter, Coy SaunasRosemary. She has spoken with her brother and sister and they are in [facility]. Mariane DuvalGinny Ward, RN, hospital liason for the [facility] aware and will see pt in AM.   Electronic Signatures: Tyiana Hill, Harriett SineNancy (MD)  (Signed 26-Feb-13 15:29)  Authored: Palliative Care   Last Updated: 26-Feb-13 15:29 by Joplin Canty, Harriett SineNancy (MD)

## 2014-09-14 NOTE — H&P (Signed)
PATIENT NAME:  Madison Stanley, Madison Stanley MR#:  [id number] DATE OF BIRTH:  1923-06-07  DATE OF ADMISSION:  07/13/2011  PRIMARY CARE PHYSICIAN: Lorie Phenix, MD   She is a resident of [facility].   CHIEF COMPLAINT: Left groin pain. History was obtained from the patient and the daughters.   HISTORY OF PRESENT ILLNESS: This is an 81 year old female who has multiple medical problems. She has diabetes, hypertension, hyperlipidemia, chronic kidney disease stage IV, she follows with Nephrology, coronary artery disease. She was admitted in May of 2012. She was found to have acute right frontoparietal infarct at that time. She was recently admitted to the hospital on 07/01/2011 and she was discharged on 07/06/2011 with a diagnosis of acute on chronic diastolic failure, anasarca, CKD stage IV, malignant hypertension, diabetes mellitus with hypoglycemia, and urinary retention. She was receiving physical therapy at [facility]. She was brought in today because of severe left groin pain radiating to her back. The daughter is an Nutritional therapist. She says that the patient was able to do some physical therapy yesterday but she has been complaining of some left groin pain since Sunday. She did not have any recent fall. The last fall was two weeks ago and she had a hospital stay after the fall and she was getting physical therapy in the hospital. She has been complaining of left groin pain in the past also but this is the most severe pain that she has complained of today. She was having some nausea and vomiting a few days ago but she says the nausea and vomiting has improved right now. She got multiple doses of morphine in the Emergency Room. She got about a total of 4 mg of IV morphine. She had a CT of the abdomen and pelvis done in the Emergency Room without contrast which only showed mild fluid signal and expansion of the left iliacus muscle which may represent injury and recommended MRI. Hospitalist was asked to admit the patient  because of significant left hip pain and groin pain without any obvious fracture on the CAT scan. She was also found to have abnormal urinalysis with an indwelling Foley catheter showing a leukocyte esterase of 3+ and 189 WBCs per high-power field. She also got a dose of IV Rocephin in the Emergency Room. She denies any chest pain, any shortness of breath. She is being admitted for left groin pain and hip pain with chronic diastolic failure, CKD stage IV, anemia of chronic disease, hypertension, diabetes, and urinary tract infection.     PAST MEDICAL HISTORY:  1. CKD stage IV. She follows with Dr. Thedore Mins and Dr. Cherylann Ratel. Her last creatinine at discharge was 2.17 on February 13th with GFR of around 23.  2. Diabetes with hypoglycemia.  3. Hypertension.  4. Hyperlipidemia.  5. Hypothyroidism.  6. Coronary artery disease. 7. History of subdural hematoma, tiny, in May of 2012.  8. History of GI bleed in February of 2012.  9. She was admitted in November of 2012 because of acute left frontoparietal infarct. She has carotid stenosis in the range of 50%. She also followed with Vascular Surgery.  10. Anemia of chronic disease. She follows with Dr. Sherrlyn Hock at the [facility]. 11. She was recently admitted from 07/01/2011 to 07/06/2011 because of anasarca because of acute on chronic diastolic failure and CKD stage IV. 12. Depression.  13. She had altered mental status because of high doses of Remeron. 14.  She also had urinary retention and now she has an indwelling Foley  catheter inserted at [facility].   PAST SURGICAL HISTORY:  1. Hysterectomy. 2. Bladder surgery.  3. Cholecystectomy.  4. Breast biopsy. 5. Right knee replacement.   ALLERGIES TO MEDICATIONS: Demerol, metformin, penicillin, and sulfa drugs.   HOME MEDICATIONS FROM [facility]:  1. Aggrenox 1 tab p.o. b.i.d.  2. Synthroid 88 mcg daily.  3. Ultram 50 mg q.6 hours as needed.  4. Toprol-XL 50 mg daily. 5. Norvasc 10 mg  daily.  6. Flomax 0.4 mg daily.  7. Zocor 20 mg at bedtime.  8. Ferrous sulfate 325 mg b.i.d.  9. Vitamin B12 500 mcg 4 tabs p.o. once daily. 10. Bumetanide 1 mg once a day. 11. Norco 5/500 q.4 hours p.r.n.  12. Tylenol 325 mg 2 tabs q.4 hours p.r.n.  13. Xanax XR 0.5 mg p.o. b.i.d. p.r.n.  14. Lisinopril 40 mg p.o. b.i.d.  15. Imdur 30 mg daily.  16. Zoloft 50 mg at bedtime. 17. Remeron 7.5 mg at bedtime.  18. Hydralazine 50 mg q.i.d.  19. Sliding scale coverage. 20. Lantus and glyburide have been discontinued.   SOCIAL HISTORY: She is a resident at [facility] since her stroke in November of 2012. No smoking or alcohol use.   FAMILY HISTORY: History of heart disease in her mother.   PHYSICAL EXAMINATION:   VITAL SIGNS: When she presented to the Emergency Room, temperature 98.6, heart rate 85, respiratory rate 20, blood pressure 123/52, saturating 85% on room air. Currently heart rate 85, blood pressure 117/45, saturating 95% on 2 liters nasal cannula.   GENERAL: This is an elderly Caucasian female. She is drowsy right now after the morphine.   HEENT: Bilateral pupils are equal. Extraocular muscles are intact. No scleral icterus. No conjunctivitis. Oral mucosa is moist, pale.   NECK: No thyroid tenderness, enlargement, or nodules. Neck is supple. No masses, nontender. No adenopathy. No JVD. No carotid bruit.   CHEST: She has some crackles at the left base, essentially clear. Normal respiratory effort. Not using accessory muscles of respiration.   HEART: Heart sounds are regular. No murmur. She has lower extremity edema but has improved from before. Bilateral dorsalis pedis is 1+.   ABDOMEN: Soft, nontender. Normal bowel sounds. No hepatosplenomegaly. No bruit. No masses.   RECTAL: Deferred at this time.    NEUROLOGIC: She is awake. She is drowsy after the morphine but arousable. She is moving her right lower extremity against gravity but she is unable to flex her left hip.  She is complaining of pain in the left groin area. She is able to flex her left knee. She is unable to raise her left leg against gravity.   SKIN: She has poor skin turgor. She has some bruise on the face area and abdominal wall.   LABORATORY, DIAGNOSTIC, AND RADIOLOGICAL DATA: White count 12.6, hemoglobin 9.1, platelet count 231. Her last hemoglobin was 8.6 on February 13th. BMP sodium 138, potassium 4.7, BUN 38, creatinine 2.36 with a GFR of around 21. Her last GFR was 23. Albumin 3.1. Lipase is 129. Magnesium is 1.7. Her urinalysis today shows cloudy urine with 3+ leukocyte esterase and WBCs 189 per high-power field. Urine culture has been sent. Previous urine culture on February 12th showed 15,000 colonies of Candida. She had a CT of abdomen and pelvis without contrast which showed some mild fluid signal and expansion of the left iliacus muscle which may represent injury, bilateral small pleural effusions. She had a recent Doppler ultrasound of the lower extremity that was negative  for any DVT.   IMPRESSION:  1. Left hip pain, rule out fracture, rule out muscle injury. 2. Urinary tract infection with indwelling Foley catheter.  3. Leukocytosis, may be secondary to left hip pain versus urinary tract infection. 4. Chronic kidney disease, stage IV.  5. Diabetes.  6. Hypertension.  7. Hyperlipidemia.  8. History of cerebrovascular accident. 9. Chronic diastolic failure, compensated at this time.  10. Depression. 11. Anemia of chronic disease.  PLAN: This is an 81 year old female with multiple medical problems. She had a stroke in November of 2012 with left-sided weakness, diabetes, hypertension, hyperlipidemia, coronary artery disease, and CKD stage IV. She was recently admitted for anasarca, acute on chronic diastolic failure and CKD stage IV. She is well diuresed right now. Today she presented with severe left groin pain. She got about 4 mg of IV morphine in the Emergency Room where she is still  complaining of severe pain. She is drowsy after the morphine. I'm going to continue low dose morphine 1 to 2 mg IV q.3 to 4 hours as needed and give her Norco in between. Will hold her Ultram because I'm giving her morphine and Norco at this time. She had a CT of the abdomen and pelvis done which showed some expansion of the left iliacus muscle and will order an MRI of the left hip and also consult on her. Her urinalysis is pyuric but it has improved from before. Urine culture has been sent. She got IV ceftriaxone in the Emergency Room. I am going to continue IV ceftriaxone on her. She has some leukocytosis which could be stress response versus secondary to urinary tract infection. Will continue her bumetanide 1 mg daily and watch her renal function. Will continue Aggrenox and statin for her CVA and continue Toprol, Norvasc, lisinopril, Imdur, and hydralazine for her hypertension.   Plan was discussed with the patient and the daughters in detail. The patient has a portable DO NOT RESUSCITATE in the chart and I confirmed the DO NOT RESUSCITATE status with the daughters and they agreed with that and will order DO NOT RESUSCITATE. The patient has a poor prognosis considering her multiple comorbidities. I am not ordering a PT consult at this time unless MRI is done.   TIME SPENT WITH ADMISSION AND COORDINATION OF CARE: 55 minutes.   ____________________________ Fredia SorrowAbhinav Adair Lauderback, MD ag:drc D: 07/13/2011 20:49:59 ET T: 07/14/2011 06:29:45 ET JOB#: [id number]  cc: Fredia SorrowAbhinav Quincy Boy, MD, <Dictator> Leo GrosserNancy J. Maloney, MD Fredia SorrowABHINAV Shawndrea Rutkowski MD ELECTRONICALLY SIGNED 08/12/2011 11:23

## 2014-09-14 NOTE — Consult Note (Signed)
PATIENT NAME:  Madison Stanley, Madison Stanley MR#:  [id number] DATE OF BIRTH:  May 09, 1924  DATE OF CONSULTATION:  07/15/2011  REFERRING PHYSICIAN:  Enid Baasadhika Kalisetti, MD CONSULTING PHYSICIAN:  Kandyce RudGeorge W. Kernodle Jr., MD  REASON FOR CONSULTATION: Left groin pain, ? steroids.  HISTORY OF PRESENT ILLNESS: The patient is an 81 year old white female. She has a history of osteoarthritis with a right knee replacement. She has a history of diabetes. She had a cerebrovascular accident around Thanksgiving involving the left arm and leg. She was hospitalized and went to physical therapy. She had some episodic groin pain at that time. She had a urinary tract infection along the way and has been treated with Cipro. She had a hip CT that showed no fracture. She had renal insufficiency, was diuresed in the hospital as well as prior to that received IV blood transfusion because of anemia. She has been ambulating with a walker with her daughter mainly for balance with episodic pain in the groin. She had acute pain two days ago and could hardly bear weight on it and developed some edema.  MRI showed no fracture of the hip, sacrum or pelvis, a psoas tear and edema inside the psoas gluteus muscles. She has been tender to touch or rotate in position the hip and was said to require some analgesics. The right hip does not bother her. She has never had podagra. Knees and hands have not been swollen.   PAST MEDICAL HISTORY:  1. Diabetes. 2. Kidney stones. 3. Renal failure.  4. Right knee replacement.  5. Cerebrovascular accident.   MEDICATIONS: Amlodipine, aspirin, Bumex, ceftriaxone, iron, hydralazine, insulin sliding scale, isosorbide, levothyroxine and metoprolol.   SOCIAL HISTORY: No cigarettes or alcohol.   FAMILY HISTORY: Diabetes, remote rheumatoid arthritis in a parent.    PHYSICAL EXAMINATION:  GENERAL: Sleepy but awakens from her sleep. She drifts in and out of sleep. She has pain with motion around the bed.   VITAL  SIGNS: Temperature 99, pulse 73, respirations 18, blood pressure 126/61, pulse oximetry 90.    HEENT: Sclerae are clear. Clear oropharynx.   CHEST: Clear.   HEART: No significant murmur.   ABDOMEN: No visceromegaly.   MUSCULOSKELETAL: She has pitting edema over the left hip. There is mild erythema. No definite ecchymosis. Neck moves well. Hips move well. Hands are without synovitis. Left hip will rotate. She hurts with positioning in the bed. She hurts in touching the sacrum or the pubic area. She hurts when she reflexes a hip. Her right hip moves well. Knees are without effusion. Symmetric reflexes.   IMPRESSION:  1. Psoas tendon tear, recent Cipro, could be quinolone related. She did have a rehab hospitalization x2. She could have had tendinitis with a just a spontaneous rupture.  2. Edema, does not appear to be toxic like pyomyositis, but risk for that with diabetes and renal insufficiency. Last white count was 10,000. Temperature 99.  3. She has been covered antibiotics.  4. Cerebrovascular accident.  5. Diabetes.  6. History of kidney stones.   PLAN: I would be hesitant to use steroids. I agree with PT and observation for her to make sure that there is not enough evidence that this is infectious with abscess formation. She may need a repeat CT in a few days. Adequate analgesia.  ____________________________ Kandyce RudGeorge W. Kernodle Jr., MD gwk:cbb D: 07/15/2011 14:40:38 ET T: 07/15/2011 17:43:48 ET JOB#: [id number] Webb SilversmithGEORGE W KERNODLE J MD ELECTRONICALLY SIGNED 07/18/2011 13:37

## 2014-09-14 NOTE — Consult Note (Signed)
Brief Consult Note: Diagnosis: left hip/pelvic pain.   Patient was seen by consultant.   Comments: agree with MRI, may be hematoma with recent h/o anemia, abscess less likely but possible does not have appearance of septic joint will recheck later today after MRI.  Electronic Signatures: Leitha SchullerMenz, Khamille Beynon J (MD)  (Signed 21-Feb-13 07:25)  Authored: Brief Consult Note   Last Updated: 21-Feb-13 07:25 by Leitha SchullerMenz, Szymon Foiles J (MD)

## 2014-09-14 NOTE — Discharge Summary (Signed)
PATIENT NAME:  Madison Stanley, Madison Stanley MR#:  [id number] DATE OF BIRTH:  02-Sep-1923  DATE OF ADMISSION:  07/01/2011 DATE OF DISCHARGE:  07/06/2011  ADMITTING DIAGNOSIS: Anasarca.   DISCHARGE DIAGNOSES:  1. Anasarca due to congestive heart failure, left heart, acute on chronic, diastolic.  2. CKD, stage IV, acute on chronic exacerbation of kidney disease.  3. Hypertension, malignant.  4. Diabetes mellitus with hypoglycemia, all diabetic medications now on hold.  5. Questionable urinary tract infection. Cultures were negative after antibiotic therapy.  6. Depression.  7. Anemia.  8. Altered mental status to high dose of Remeron.  9. Urinary retention after Foley catheter withdrawal.   DISCHARGE CONDITION: Fair.   DISCHARGE MEDICATIONS: The patient is to resume her outpatient medications which are: 1. Aggrenox 25/200 one capsule twice daily.  2. Synthroid 88 mcg p.o. daily.  3. Ultram 50 mg p.o. every six hours as needed.  4. Toprol-XL 50 mg p.o. daily.  5. Norvasc 10 mg p.o. daily.  6. Flomax 0.4 mg p.o. daily.  7. Zocor 20 mg p.o. at bedtime.  8. Iron sulfate 325 mg p.o. twice daily.  9. Vitamin B12 500 mcg 4 tablets, which will be 2000 mcg p.o. daily.  10. Bumex 1 mg p.o. daily.  11. Norco 5/500 one tablet every four hours as needed.  12. Tylenol 325 mg 2 tablets every four hours as needed.  13. Xanax XR 0.5 mg 1 tablet twice daily as needed.   ADDITIONAL MEDICATIONS:  1. Lisinopril 40 mg p.o. twice daily. This is a new dose. 2. Sliding scale insulin. 3. Imdur 30 mg p.o. daily.  4. Zoloft 50 mg p.o. at bedtime. 5. Remeron 7.5 mg p.o. at bedtime. This is new dose. 6. Hydralazine 50 mg p.o. four times daily.   DO NOT TAKE: The patient was advised not to take Lantus or glyburide until unless recommended by primary care physician.   DIET: 1800 ADA, mechanical soft, low fat, low cholesterol, 2 gram salt.   ACTIVITY LIMITATIONS: As tolerated.   REFERRAL: Physical therapy 2 to 7 times a  week.    FOLLOW-UP: Follow-up appointment with Dr. Elease HashimotoMaloney in two days after discharge.   CONSULTANTS:  1. Dr. Jennet MaduroPucilowska  2. Care Management  3. Dr. Rexene EdisonMunsoor Lateef  4. Dr. Mosetta PigeonHarmeet Singh   RADIOLOGIC STUDIES: 1. Chest, PA and lateral, 07/01/2011, findings which may reflect low-grade CHF. There is likely an element of underlying COPD. No focal pneumonia. 2. Repeated chest x-ray, PA and lateral, 07/04/2011, findings concerning for right and left upper lobe infiltrates as well as left lower lobe infiltrate. There is mild prominence of interstitial markings and underlying component of pulmonary edema is also of diagnostic consideration according to the radiologist.   REASON FOR ADMISSION: The patient is an 81 year old Caucasian female with past medical history significant for history of CKD who was sent by Dr. Thedore MinsSingh from the office due to worsening lower extremity edema requiring IV diuretics. Apparently the patient had a stroke in November 2012 with residual left hemiparesis, was sent from our hospital to [facility] re[facility] and seen by Dr. Thedore MinsSingh on 06/17/2011 for initial evaluation of her stage IV CKD. At that time, she had bilateral lower extremity swelling, was placed on Bumex as well as Lasix. She was symptomatic with increasing fatigue as well as weakness over the past few days prior to coming to the Emergency Room. She was sent to see Dr. Sherrlyn HockPandit for anemia of chronic disease. She required transfusion of one unit of  packed red blood cells in February 2013 for symptomatic anemia. She was seen by Dr. Thedore Mins as follow-up and was found to be having worsening lower extremity swelling which was spreading into her lower abdomen. For this reason, she was admitted. Please refer to Dr. Prudencio Pair admission note on 07/01/2011.   PHYSICAL EXAMINATION: On arrival to the Emergency Room, the patient's temperature was 98.9, pulse 84, respiratory rate 20, blood pressure 152/56. Saturation was 92% on room air.  Physical exam revealed fine bibasilar crackles but no wheezes or rhonchi. No accessory muscles to breathe. Her lower extremities were swollen to 3+ all the way up to the thighs extending to the lower abdomen.  LABORATORY DATA: Lab data on 07/01/2011 showed glucose 208, BUN and creatinine were 25 and 1.81, sodium level 146, otherwise unremarkable BMP. The patient's liver enzymes were unremarkable. The patient's white blood cell count was normal at 9.7, hemoglobin 9.6, platelets 219, absolute neutrophil count was slightly elevated at 7.3 on initial admission. Urinalysis revealed cloudy straw urine, 50 mg/dL glucose, negative for bilirubin or ketones, specific gravity 1.008, pH 6.0, 1+ blood, 100 mg/dL protein, negative for nitrites, 3+ leukocyte esterase, 17 red blood cells, and 403 white blood cells and trace bacteria were noted. EKG showed sinus rhythm with marked sinus arrhythmia at rate of 86, low voltage QRS, no acute ST-T changes were noted and no significant change since prior EKG. The patient's chest x-ray was concerning for CHF as well as element of COPD but no focal pneumonia.   HOSPITAL COURSE: The patient was admitted to the hospital. She was started on IV Lasix and her condition somewhat improved. She was taken off Lasix and tried Bumex orally, however, she was having more shortness of breath as well as not improved swelling. For this reason, repeat chest x-ray was performed on 07/04/3011. At that time findings concerning for possible infiltrates were noted as well as prominence of interstitial markings. Because the patient did not have symptomatology of pneumonia such as cough, fever, or white blood cell count elevation, it was felt that very unlikely she had pneumonia, however, CHF was implicated. The patient was diuresed again. It was felt that the patient's anasarca was very likely related to her CKD and questionable congestive heart failure. The patient had echocardiogram done recently so no  echocardiogram was repeated, however, in the recent past actually on 04/13/2011 the patient's echocardiogram revealed low normal LVF, estimated ejection fraction of 50%, LVH as well as mild MR and TR. It was felt that the patient's anasarca could have been related to CKD as well as CHF, diastolic. She was managed on p.o. Bumex, following her ins and outs. Eventually the patient's Foley catheter was discontinued in order to prepare her for discharge and she was noted to have urinary retention. On 07/05/2011 the patient had 700 mL of fluid retention. Upon discussion with her daughter, it appeared that in fact in the past she was on Flomax and, unfortunately, Flomax was not tolerated on the day of admission so now the patient is getting in and out catheterizations as needed every eight hours and she is back on Flomax. It is recommended to follow the patient's urine output very closely to make sure that she is getting her urine output and not retaining fluids in her bladder.  In regards to CKD, as mentioned above the patient received high doses of IV diuretics initially and even with those diuretics the patient's kidney function remained relatively stable. As mentioned above, the patient's creatinine was  1.81 on day of admission, 07/01/2011. It remained relatively stable, somewhat worsened, but not significantly so until the day of discharge. On day of discharge, 07/06/2011, the patient's creatinine is 2.17. Nephrology followed the patient along and made decisions about her medication and therapy. The patient is to continue diuretics and follow-up as outpatient with Nephrology in the next few days after discharge.   In regards to hypertension, the patient was noted to have significantly elevated blood pressure which was very difficult to control. It would stay high at around 160's or intermittently even higher and was very poorly responding to medications, however, the patient had advanced ACE inhibitor, lisinopril  to maximum dose of 40 mg p.o. twice daily dose. She is to continue Norvasc as well as metoprolol for her blood pressure. She also had Imdur added and increasing doses of hydralazine. With this therapy, the patient's blood pressure improved and on day of discharge the patient's temperature was 97.5, pulse 69, respiration rate 20, blood pressure 126/55, saturation 95% on room air at rest. It is recommended to follow the patient's blood pressure readings also very closely and make sure that her blood pressure is well controlled.   In regards to hypothyroidism, the patient is to continue Synthroid.   In regards to diabetes mellitus, as the patient had relatively poor oral intake she was noted to be hypoglycemic. Initially the patient's glyburide was placed on hold but later on because of continued hypoglycemia also Lantus was placed on hold. Only sliding scale insulin was ordered and the patient did satisfactory with that. On the day of discharge, however, her oral intake is about 75% of offered meals. It is recommended to resume her outpatient medications, diabetic medications, if her blood glucose levels increase and her oral intake improves. It was felt that the patient's poor oral intake was related to high doses of Remeron which was given for her at bedtime but also probably because of quite poorly controlled depression.  In regards to depression, Dr. Jennet Maduro was consulted and she recommended to use the patient's Remeron dose and advance Zoloft which was already started for her while in the hospital. The patient is to continue those medications and advance Zoloft as necessary. Meanwhile, we will provide her with good sleep as well as will allow her to improve her oral intake.   The patient's urinalysis came back abnormal. It was felt that the patient possibly had urinary tract infection. She was started on ciprofloxacin, however, urine cultures were never taken. When the patient's urine cultures were  taken, the patient was already on a few days of ciprofloxacin antibiotic and urine cultures came back negative and ciprofloxacin eventually was discontinued.   In regards to urinary retention, as mentioned above it was unclear if the patient had this retention due to sedation, recent use of Foley catheter versus neurogenic due to recent diagnosis of CVA, however, the patient was advised to continue in and out catheterizations every eight hours as well as Flomax and follow her urine output as outpatient very closely.   The patient is being discharged with the above-mentioned medications and follow-up.   TIME SPENT: 40 minutes.  ____________________________ Katharina Caper, MD rv:drc D: 07/06/2011 14:41:07 ET T: 07/06/2011 15:23:21 ET JOB#: [id number]  cc: Katharina Caper, MD, <Dictator> Leo Grosser, MD Verdie Barrows MD ELECTRONICALLY SIGNED 07/11/2011 12:00

## 2014-09-14 NOTE — Discharge Summary (Signed)
PATIENT NAME:  Madison Stanley, Madison Stanley MR#:  [id number] DATE OF BIRTH:  1924/02/20  DATE OF ADMISSION:  07/13/2011 DATE OF DISCHARGE:  07/20/2011  DISCHARGING PHYSICIAN: Enid Baas, MD  PRIMARY CARE PHYSICIAN: Lorie Phenix, MD  CONSULTANTS:  1. Kennedy Bucker, MD - Orthopedics. 2. Kandyce Rud MD - Rheumatology. 3. Mosetta Pigeon, MD - Nephrology. 4. Ned Grace, MD - Palliative Care.  DISCHARGE DIAGNOSES:  1. Left psoas tendon rupture and also iliacus muscle edema.  2. Cerebrovascular accident with left-sided hemiparesis.  3. Failure to thrive with poor oral intake.  4. Diabetes mellitus.  5. Hypertension.  6. Chronic kidney disease, stage IV.  7. Acute on chronic anemia.  8. Depression.  9. Hypothyroidism.  10. Urinary retention with chronic indwelling Foley.  11. Candida urinary tract infection, likely from the chronic Foley catheter.  12. Constipation.   DISCHARGE MEDICATIONS:  1. Roxanol 20 mg/mL, 0.5 to 1 mL p.o. or sublingual every 1 to 2 hours p.r.n.  2. Ativan 0.5 to 1 mg p.o. every 2 to 4 hours p.r.n.  3. Ranitidine 150 mg p.o. twice a day. 4. ABHR suppository per rectum every 4 to 6 hours p.r.n.  5. Fentanyl patch 50 mcg transdermal every 3 days.  6. Fluconazole 100 mg p.o. daily for five days.  7. Aggrenox 25/200 mg 1 capsule p.o. twice a day. 8. Synthroid 88 mcg p.o. daily.  9. Toprol-XL 50 mg p.o. daily.  10. Norvasc 10 mg p.o. daily.  11. Flomax 0.4 mg p.o. daily.  12. Zocor 20 mg p.o. daily.  13. Ferrous sulfate 325 mg p.o. twice a day. 14. Tylenol 650 mg p.o. every four hours as needed for pain.  15. Remeron 7.5 mg p.o. at bedtime.  16. Imdur 30 mg p.o. daily.  17. Zoloft 50 mg p.o. daily.  18. Hydralazine 50 mg p.o. four times daily.  HOME OXYGEN: 2 liters, but the patient is refusing.   DISCHARGE DIET: ADA diet as tolerated.   DISCHARGE ACTIVITY: As tolerated.   FOLLOWUP INSTRUCTIONS: The patient is being transferred to hospice  home.  LABS AND IMAGING STUDIES: WBC 10.8, hemoglobin 10.5, hematocrit 31.7, and platelet count 248.   Sodium 133, potassium 5.4, chloride 99, bicarbonate 23, BUN 60, creatinine 2.5, glucose 248, calcium 9.4. CRP elevated at 66.  MRI of the left hip without contrast showed edema within left psoas and iliacus muscle related to muscle strain or injury and psoas tendon appears to be torn from its attachment to the lesser trochanter. Nonspecific edema is present in the deep fascia, of the proximal leg. Inflammatory or infectious fasciitis cannot be excluded. No discrete fluid collection has been identified. Nonspecific edema and subcutaneous fat of pelvis and upper legs bilaterally seen.   She did receive 1 unit of packed RBC transfusion during this admission.   Blood cultures remained negative.   CT of the abdomen and pelvis showed mild fluid signal and expansion of the left iliacus muscle which may represent injury, but MRI recommended.  Bilateral small pleural effusions are seen.   Urinalysis positive for leukocyte esterase, bacteria, and WBCs. Culture is growing greater than 100,000 culture colonies of Candida albicans.   BRIEF HOSPITAL COURSE: Ms. Madison Stanley is an 81 year old elderly Caucasian female with multiple medical problems including diabetes, hypertension, progressively worsening CKD, currently at stage IV, transfusion-dependent anemia, and recent cerebrovascular accident in November with left-sided hemiparesis who is a resident of [facility] who was brought in secondary to left groin pain.  1. Left groin  pain: CT showed diffuse fluid signal in the iliacus muscle and MRI was done which showed nonspecific edema of the iliacus muscle and psoas tendon rupture. She was seen by orthopedics and rheumatology who agreed that steroids are not going to benefit at this time and there is no surgery for her ruptured tendon. They recommended pain control and physical therapy. There is no discrete fluid  collection suspicious for any abscess or infection. There is no significant fluid in one spot even to tap or drain. She was started on pain management. The patient had trouble achieving adequate pain control, and she could not work very well with physical therapy.  Her daughters who were both at bedside, with her poor quality of life with severe pain and failure to thrive with poor p.o. intake, agreed to talk to palliative care who has been adjusting the patient's pain medications. With worsening clinical condition, the family agreed for hospice home transfer. She was placed on fentanyl patch for pain and Roxanol for breakthrough pain.  2. Candida urinary tract infection secondary to chronic indwelling Foley catheter from urinary retention. She was started on fluconazole while in the hospital and will be continued. 3. Cerebrovascular accident with left hemiparesis. The patient is on Aggrenox.  4. Hypothyroidism. The patient is on Synthroid.  5. Chronic kidney disease stage IV, gradually worsening chronic kidney disease with overall poor condition.  No aggressive measures like dialysis at this point and the patient will be transferred to the hospice home.  6. Hypertension. She is on Toprol, Norvasc, hydralazine and Imdur.  7. Acute on chronic anemia of chronic kidney disease. She required 1 unit packed RBC transfusion this admission. She follows up with Dr. Sherrlyn HockPandit as an outpatient. On oral iron supplements currently. Her course has been otherwise uneventful.   DISCHARGE CONDITION: Guarded with poor prognosis.   CODE STATUS: DO NOT RESUSCITATE.      DISCHARGE DISPOSITION: Hospice Home.   TIME SPENT ON DISCHARGE: 45 minutes.  ____________________________ Enid Baasadhika Yarexi Pawlicki, MD rk:slb D: 07/22/2011 11:43:12 ET T: 07/22/2011 13:56:38 ET JOB#: [id number]  cc: Enid Baasadhika Alphonsus Doyel, MD, <Dictator> Leo GrosserNancy J. Maloney, MD Enid BaasADHIKA Kennedy Bohanon MD ELECTRONICALLY SIGNED 07/25/2011 13:51

## 2014-09-14 NOTE — H&P (Signed)
PATIENT NAME:  Madison Stanley, ESTERS MR#:  [id number] DATE OF BIRTH:  Nov 25, 1923  DATE OF ADMISSION:  07/01/2011  ADMITTING PHYSICIAN: Gladstone Lighter, MD  PRIMARY CARE PHYSICIAN: Margarita Rana, MD  PRIMARY NEPHROLOGIST: Murlean Iba, MD  REASON FOR ADMISSION: Sent in by Dr. Candiss Norse for worsening lower extremity swelling requiring IV diuretics.   BRIEF HISTORY: Madison Stanley is an 81 year old Caucasian female who is a retired Marine scientist with past medical history significant for hypertension, diabetes, chronic kidney disease stage IV with baseline GFR of 24, anemia of chronic disease, and coronary artery disease status post stent placement who had recent admission in November 2012 for CVA resulting in left residual hemiparesis. The patient was sent to [place] at discharge at the time. The patient has seen Dr. Candiss Norse in the office, on 06/17/2011, for initial evaluation for her CKD stage IV. She has bilateral lower extremity swelling and was placed on Bumex 1 mg along with low dose Lasix. She has been very symptomatic with extreme fatigue and weakness over the last few weeks. So she was sent to see a hematologist, Dr. Leia Alf, for her anemia of chronic disease. Blood work was done and she did receive 1 unit of packed RBC transfusion in 06/2011 for symptomatic anemia. She went to see Dr. Candiss Norse for followup today and was found to have worsening lower extremity edema spreading onto her lower abdomen. So she was admitted directly from Dr. Keturah Barre office for IV diuretics and failed outpatient p.o. diuretics.   The patient has residual left hemiparesis from her recent stroke in November 2012 and is wheelchair bound at baseline.   PAST MEDICAL HISTORY:  1. Hypertension.  2. Type 2 diabetes mellitus, insulin-dependent.  3. Coronary artery disease with stent placement.  4. History of gastrointestinal bleed secondary to diverticulosis.  5. Osteoarthritis.  6. Anemia of chronic disease.  7. Cerebrovascular  accident with acute right parietofrontal infarct resulting in residual left-sided weakness.  8. Carotid stenosis, left internal carotid artery, 50% to 75% stenosis.   PAST SURGICAL HISTORY:  1. Cardiac stent placement.  2. Cholecystectomy.  3. Hysterectomy.  4. Cataract removal.  5. Appendectomy.  6. Right knee total replacement surgery.   SOCIAL HISTORY: She is currently a resident of [facility]. No history of any smoking or alcohol use. 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 She has two daughters one of which is a physical therapist and the other one is an ER nurses in [place].   DRUG ALLERGIES: Sulfa, metformin, penicillin, Demerol.  HOME MEDICATIONS:  1. Lantus 10 units subcutaneous at bedtime.  2. Ferrous sulfate 325 mg p.o. b.i.d.  3. Aggrenox 25/200 mg one capsule p.o. b.i.d.  4. Synthroid 88 mcg p.o. daily.  5. Toprol-XL 50 mg p.o. daily.  6. Lisinopril 40 mg p.o. daily.  7. Norvasc 10 mg p.o. daily.  8. Flomax 0.4 mg p.o. daily.  9. Vitamin B12 500 mcg p.o. daily.  10. Bumex 1 mg p.o. daily.  11. Glyburide 10 mg p.o. daily.  12. Lasix 10 mg p.o. daily.  13. Zocor 20 mg p.o. at bedtime.  14. Remeron 15 mg p.o. at bedtime.  15. Vicodin 5/500 one tablet every four hours p.r.n. for pain.  16. Tylenol 650 mg p.o. every 4 hours p.r.n. for pain.  17. Ultram 50 mg p.o. every 6 hours p.r.n. for pain.  18. Xanax 0.5 mg p.o. at bedtime p.r.n.  19. Xanax 0.5 mg p.o. twice a day p.r.n. for anxiety.  20.  Cipro 250 mg p.o. twice a day for seven days for urinary tract infection, will be finished on 07/02/2011.  FAMILY HISTORY: Both parent's died of old age.  REVIEW OF SYSTEMS: CONSTITUTIONAL: No fever. Positive for fatigue and weakness. EYES: No blurred vision, double vision. Status post cataract surgery. No inflammation or glaucoma. ENT: No tinnitus, ear pain, epistaxis, or discharge. No hearing loss. RESPIRATORY: No cough, wheeze, hemoptysis, or chronic  obstructive pulmonary disease. CARDIOVASCULAR: No chest pain, orthopnea, edema, arrhythmia, palpitations, or syncope. GI: No nausea, vomiting, abdominal pain, hematemesis, or melena. GENITOURINARY: No dysuria. Decreased frequency of urination. No hematuria. ENDOCRINE: No polyuria, nocturia, thyroid problems, heat or cold intolerance. HEMATOLOGY: Positive for anemia. No easy bruising or bleeding. SKIN: No acne, rash, or lesions. MUSCULOSKELETAL: Positive for chronic low back pain. No gout. Positive for arthritis. NEUROLOGIC: Recent cerebrovascular accident with left residual hemiparesis. No seizures. PSYCHOLOGICAL: No anxiety, insomnia, or depression.   PHYSICAL EXAMINATION:   VITAL SIGNS: Temperature 98.9 degrees Fahrenheit, pulse 84, respirations 20, blood pressure 152/56, pulse oximetry 92% on room air.   GENERAL: Well-built, well-nourished female lying in bed, not in any acute distress.   HEENT: Normocephalic because of recent trauma after the patient fell from her recliner. She has ecchymosis of her whole left side of the face which is healing. Pupils are equal, round, and reacting to light. Anicteric sclerae. Extraocular movements intact. Oropharynx clear without erythema, mass, or exudates.  NECK: Supple. No thyromegaly, JVD, or carotid bruits. No lymphadenopathy.   LUNGS: Clear to auscultation bilaterally with fine bibasilar crackles. No wheeze or rhonchi. No use of accessory muscles for breathing.   HEART:  S1 and S2, regular rate and rhythm. No murmurs, rubs, or gallops.   ABDOMEN: Soft, nontender, and nondistended. No hepatosplenomegaly. Normal bowel sounds.   EXTREMITIES: 3+ edema all the way up to the thighs, extending onto the lower abdomen. Dorsalis pedis pulses palpable feebly bilaterally. No clubbing or cyanosis.   SKIN: No acne, rash, or lesions.   LYMPHATICS: No cervical or inguinal lymphadenopathy.   NEUROLOGIC: Cranial nerves are intact. Motor strength is 4/5 in left upper  extremity and 3/5 in left lower extremity. The right side has 5/5 strength.  No sensory abnormality.   PSYCH: Awake, alert, and oriented x3.   LABS/STUDIES: WBC 9.7, hemoglobin 9.6, hematocrit 28.3, and platelet count 219. Her basic metabolic panel is pending. She recently had anemia work-up done by Dr. Ma Hillock four days ago and it shows serum iron of 48, iron binding capacity of 262, LDH 202, and erythropoietin level is normal at 19.6. Vitamin B12 is elevated at 1112. Haptoglobin is high at 309. Folic acid is 01.7. Direct Coombs test is negative. Serum protein electrophoresis does not show any M spike. Retic count 1.4 and absolute reticulocyte count 0.037. Urine electrophoresis did not show a monoclonal spike.   EKG and chest x-ray are pending at this time.   ASSESSMENT AND PLAN: This is an 81 year old female with history of hypertension, CAD, diabetes, CVA, and CKD stage IV who was sent in from Dr. Keturah Barre office for worsening bilateral lower extremity edema in spite of outpatient Bumex and p.o. Lasix.  1. Anasarca with worsening of bilateral lower extremity swelling - we will start IV Lasix 80 mg IV b.i.d., per Dr. Keturah Barre recommendations. Could be from the renal disease and possible congestive heart failure. Nephrology consult. We will get a Foley also as the patient has some trouble voiding and we will need strict in and out monitoring. Her  basic metabolic panel labs are pending, her albumin is pending in the morning. Follow-up lower extremity Doppler's. She had a recent echo in November 2012 which showed an ejection fraction of 50% with moderate LVH, so probably has some diastolic dysfunction. 2. Recent cerebrovascular accident in November 2012 - the patient is currently stable with baseline neurological status, on Aggrenox. She is bedbound with wheelchair for getting around. PT consult and care management consult. She will be [facility] to [facility] from here. 3. Recent urinary tract infection  - for which she was on Cipro at the nursing home. So we will continue that until tomorrow to finish off the course. Follow-up repeat urinalysis.  4. Stage IV CKD, GFR 24 - follow basic metabolic panel, Nephrology follow-up, and get a renal ultrasound.  5. Anemia of chronic disease - she was seen by Dr. Ma Hillock as an outpatient four days ago. She did receive 1 unit of packed RBCs for symptomatic anemia. Her hemoglobin currently is stable at 9.6. However, looking at the labs it seems like she does not have significant iron deficiency or hemolysis and her reticulocyte count is not elevated as well. With her ongoing fatigue and extreme weakness in spite of blood transfusion, she might need a bone marrow biopsy to rule out any MDS, but we will leave that decision to the hematologist, and she is not a candidate for Procrit with her recent cerebrovascular accident.  6. Hypertension - continue home medications of Toprol, Norvasc, and lisinopril. Also she will be on high doses of Lasix so monitor.  7. Diabetes mellitus - continue Lantus sliding scale insulin, also on glyburide. Follow up Hemoglobin A1c. 8. Chronic low back pain - she is on Vicodin and tramadol p.r.n.  9. GI and deep vein thrombosis prophylaxis - she is on Protonix and subcutaneous heparin.   CODE STATUS: FULL CODE.   TIME SPENT ON ADMISSION: 50 minutes.  ____________________________ Gladstone Lighter, MD [id number] D: 07/01/2011 16:38:10 ET T: 07/01/2011 17:05:15 ET JOB#: [id number]  cc: Gladstone Lighter, MD, <Dictator> Jerrell Belfast, MD Sandeep R. Ma Hillock, MD Murlean Iba, MD Gladstone Lighter MD ELECTRONICALLY SIGNED 07/13/2011 11:55

## 2014-09-14 NOTE — Consult Note (Signed)
Brief Consult Note: Patient was seen by consultant.   Consult note dictated.   Comments: 1 psoas tendon tear with edema( ireviewed with radiology, no fracture) , pain. recent cipro. recent lfet body CVA with rehab x2   2. cva 3. diabetes 4. crf, proteinura     agree with analgesia, PT. hesitant to use steriods.  Electronic Signatures: Royann ShiversKernodle, Jr., Helen HashimotoGeorge Wallace (MD)  (Signed ([phone]-Feb-13 14:34)  Authored: Brief Consult Note   Last Updated: 22-Feb-13 14:34 by Royann ShiversKernodle, Jr., Helen HashimotoGeorge Wallace (MD)
